# Patient Record
Sex: Female | Born: 1995 | Race: White | Hispanic: No | Marital: Single | Smoking: Never smoker
Health system: Southern US, Community
[De-identification: ages and names within clinical notes are randomized; demographics above are authoritative.]

## PROBLEM LIST (undated history)

## (undated) HISTORY — PX: TONSILLECTOMY: SUR1361

---

## 2017-11-30 ENCOUNTER — Emergency Department (HOSPITAL_COMMUNITY)
Admission: EM | Admit: 2017-11-30 | Discharge: 2017-11-30 | Disposition: A | Discharge status: Home / Self Care | Attending: Emergency Medicine | Admitting: Emergency Medicine

## 2017-11-30 ENCOUNTER — Encounter (HOSPITAL_COMMUNITY): Payer: Self-pay | Admitting: Emergency Medicine

## 2017-11-30 ENCOUNTER — Emergency Department (HOSPITAL_COMMUNITY)

## 2017-11-30 DIAGNOSIS — R0789 Other chest pain: Secondary | ICD-10-CM

## 2017-11-30 DIAGNOSIS — R0602 Shortness of breath: Secondary | ICD-10-CM | POA: Diagnosis not present

## 2017-11-30 DIAGNOSIS — R000 Tachycardia, unspecified: Secondary | ICD-10-CM | POA: Insufficient documentation

## 2017-11-30 DIAGNOSIS — R42 Dizziness and giddiness: Secondary | ICD-10-CM | POA: Diagnosis not present

## 2017-11-30 LAB — TSH: TSH: 2.545 u[IU]/mL (ref 0.350–4.500)

## 2017-11-30 LAB — I-STAT BETA HCG BLOOD, ED (MC, WL, AP ONLY): I-stat hCG, quantitative: <5 m[IU]/mL (ref <5)

## 2017-11-30 LAB — CBC
HCT: 41.6 % (ref 36.0–46.0)
HEMOGLOBIN: 14.1 g/dL (ref 12.0–15.0)
MCH: 28.8 pg (ref 26.0–34.0)
MCHC: 33.9 g/dL (ref 30.0–36.0)
MCV: 85.1 fL (ref 78.0–100.0)
PLATELETS: 309 10*3/uL (ref 150–400)
RBC: 4.89 MIL/uL (ref 3.87–5.11)
RDW: 13.2 % (ref 11.5–15.5)
WBC: 10.6 10*3/uL — ABNORMAL HIGH (ref 4.0–10.5)

## 2017-11-30 LAB — BASIC METABOLIC PANEL
Anion gap: 11 (ref 5–15)
BUN: 11 mg/dL (ref 6–20)
CALCIUM: 9.4 mg/dL (ref 8.9–10.3)
CO2: 24 mmol/L (ref 22–32)
CREATININE: 0.8 mg/dL (ref 0.44–1.00)
Chloride: 105 mmol/L (ref 101–111)
Glucose, Bld: 82 mg/dL (ref 65–99)
Potassium: 3.3 mmol/L — ABNORMAL LOW (ref 3.5–5.1)
Sodium: 140 mmol/L (ref 135–145)

## 2017-11-30 LAB — I-STAT TROPONIN, ED: TROPONIN I, POC: 0 ng/mL (ref 0.00–0.08)

## 2017-11-30 LAB — D-DIMER, QUANTITATIVE (NOT AT ARMC)

## 2017-11-30 MED ORDER — KETOROLAC TROMETHAMINE 30 MG/ML IJ SOLN
15.0000 mg | Freq: Once | INTRAMUSCULAR | Status: AC
  Administered 2017-11-30: 15 mg via INTRAVENOUS
  Filled 2017-11-30: qty 1

## 2017-11-30 MED ORDER — METOPROLOL TARTRATE 5 MG/5ML IV SOLN
5.0000 mg | Freq: Once | INTRAVENOUS | Status: AC
  Administered 2017-11-30: 5 mg via INTRAVENOUS
  Filled 2017-11-30: qty 5

## 2017-11-30 MED ORDER — SODIUM CHLORIDE 0.9 % IV BOLUS (SEPSIS)
1000.0000 mL | Freq: Once | INTRAVENOUS | Status: AC
  Administered 2017-11-30: 1000 mL via INTRAVENOUS

## 2017-11-30 MED ORDER — LORAZEPAM 2 MG/ML IJ SOLN
0.5000 mg | Freq: Once | INTRAMUSCULAR | Status: AC
  Administered 2017-11-30: 0.5 mg via INTRAVENOUS
  Filled 2017-11-30: qty 1

## 2017-11-30 NOTE — ED Notes (Signed)
Attempted to discharge patient. Patient was not in room. Attempted to call contact number but no answer.

## 2017-11-30 NOTE — Discharge Instructions (Signed)
Please follow up with Cardiology

## 2017-11-30 NOTE — ED Triage Notes (Signed)
Patient c/o central chest pain for past couple days with SOB talking and walking at times.

## 2017-11-30 NOTE — ED Provider Notes (Signed)
[facility]-EMERGENCY DEPT Provider Note   CSN: [id number] Arrival date & time: 11/30/17  1640     History   Chief Complaint Chief Complaint  Patient presents with  . Chest Pain    HPI Denise Hays is a 22 y.o. female who presents with chest pain and SOB. No significant PMH. She states that over the past 5-6 days she's had persistent chest pain which she describes as a tightness and pressure. It is constant. Nothing makes it better or worse. Sitting up does not make it better. Over the past 1-2 days she's had SOB as well. This is with talking and exertion. No recent illness or URI. No cough, abdominal pain, N/V. No recent surgery/travel/immobilization, hx of cancer, leg swelling, hemptysis, prior DVT/PE. She is on Depo-Provera. She denies illicit drug use.   HPI  History reviewed. No pertinent past medical history.  There are no active problems to display for this patient.   History reviewed. No pertinent surgical history.  OB History    No data available       Home Medications    Prior to Admission medications   Medication Sig Start Date End Date Taking? Authorizing Provider  doxylamine, Sleep, (UNISOM) 25 MG tablet Take 25 mg by mouth at bedtime as needed for sleep.   Yes [provider]  ibuprofen (ADVIL,MOTRIN) 200 MG tablet Take 200 mg by mouth every 4 (four) hours as needed for headache.   Yes [provider]  medroxyPROGESTERone (DEPO-PROVERA) 150 MG/ML injection Inject 150 mg into the muscle every 3 (three) months.   Yes [provider]    Family History No family history on file.  Social History Social History   Tobacco Use  . Smoking status: Never Smoker  . Smokeless tobacco: Never Used  Substance Use Topics  . Alcohol use: No    Frequency: Never  . Drug use: Not on file     Allergies   Patient has no known allergies.   Review of Systems Review of Systems  Constitutional: Negative for  chills and fever.  Respiratory: Positive for chest tightness and shortness of breath. Negative for cough and wheezing.   Cardiovascular: Positive for chest pain and palpitations. Negative for leg swelling.  Gastrointestinal: Negative for abdominal pain, nausea and vomiting.  Neurological: Positive for light-headedness. Negative for syncope.  All other systems reviewed and are negative.    Physical Exam Updated Vital Signs BP (!) 154/74   Pulse (!) 128   Temp 98.3 F (36.8 C) (Oral)   Resp 20   Ht 5\' 2"  (1.575 m)   Wt 65.8 kg (145 lb)   SpO2 100%   BMI 26.52 kg/m   Physical Exam  Constitutional: She is oriented to person, place, and time. She appears well-developed and well-nourished. No distress.  HENT:  Head: Normocephalic and atraumatic.  Eyes: Conjunctivae are normal. Pupils are equal, round, and reactive to light. Right eye exhibits no discharge. Left eye exhibits no discharge. No scleral icterus.  Neck: Normal range of motion.  Cardiovascular: Tachycardia present. Exam reveals no gallop and no friction rub.  No murmur heard. Pulmonary/Chest: Effort normal and breath sounds normal. No respiratory distress.  Abdominal: Soft. Bowel sounds are normal. She exhibits no distension. There is no tenderness.  Musculoskeletal:  No calf tenderness  Neurological: She is alert and oriented to person, place, and time.  Skin: Skin is warm and dry.  Psychiatric: She has a normal mood and affect. Her behavior is  normal.  Nursing note and vitals reviewed.    ED Treatments / Results  Labs (all labs ordered are listed, but only abnormal results are displayed) Labs Reviewed  BASIC METABOLIC PANEL - Abnormal; Notable for the following components:      Result Value   Potassium 3.3 (*)    All other components within normal limits  CBC - Abnormal; Notable for the following components:   WBC 10.6 (*)    All other components within normal limits  D-DIMER, QUANTITATIVE (NOT AT [facility])  TSH   I-STAT TROPONIN, ED  I-STAT BETA HCG BLOOD, ED ([facility], [facility], [facility] ONLY)    EKG  EKG Interpretation None       Radiology Dg Chest 2 View  Result Date: 11/30/2017 CLINICAL DATA:  Central chest pain for 5-6 days with tightness, feels like something is lying on my chest, frontal headache, shortness of breath for 2 days EXAM: CHEST  2 VIEW COMPARISON:  None FINDINGS: Normal heart size, mediastinal contours, and pulmonary vascularity. Lungs clear. No pleural effusion or pneumothorax. Bones unremarkable. IMPRESSION: Normal exam. Electronically Signed   By: Ulyses SouthwardMark  Boles M.D.   On: 11/30/2017 17:22    Procedures Procedures (including critical care time)  Medications Ordered in ED Medications  sodium chloride 0.9 % bolus 1,000 mL (0 mLs Intravenous Stopped 11/30/17 2255)  ketorolac (TORADOL) 30 MG/ML injection 15 mg (15 mg Intravenous Given 11/30/17 1929)  LORazepam (ATIVAN) injection 0.5 mg (0.5 mg Intravenous Given 11/30/17 2025)  metoprolol tartrate (LOPRESSOR) injection 5 mg (5 mg Intravenous Given 11/30/17 2150)    Initial Impression / Assessment and Plan / ED Course  I have reviewed the triage vital signs and the nursing notes.  Pertinent labs & imaging results that were available during my care of the patient were reviewed by me and considered in my medical decision making (see chart for details).  22 year old female presents with chest pain and SOB. She is tachycardic in the 130s. Otherwise vitals are normal and she is well appearing. EKG is sinus tachycardia. CXR is negative. CBC is remarkable for mild leukocytosis. BMP is remarkable or mild hypokalemia. Trop is normal. TSH is normal. D dimer is normal. Shared visit with Dr. Estell HarpinZammit. Will give fluids, Toradol, Ativan and reassess.   9:37 PM Pt's HR is still in 120s. Will give dose of Metoprolol.  After Metoprolol, HR has improved to 90s. Work up is overall unremarkable. Advised pt to follow up with Cardiology. She verbalized understanding.  Return precautions given.  Final Clinical Impressions(s) / ED Diagnoses   Final diagnoses:  Atypical chest pain  Tachycardia    ED Discharge Orders    None       Beryle QuantGekas, Jase Himmelberger Marie, PA-C 11/30/17 2334    Bethann BerkshireZammit, Joseph, MD 12/01/17 [phone]

## 2017-11-30 NOTE — ED Notes (Signed)
Patient was discharged

## 2017-11-30 NOTE — ED Notes (Signed)
Patient transported to X-ray 

## 2017-12-09 ENCOUNTER — Ambulatory Visit (INDEPENDENT_AMBULATORY_CARE_PROVIDER_SITE_OTHER): Admitting: Cardiology

## 2017-12-09 ENCOUNTER — Encounter: Payer: Self-pay | Admitting: *Deleted

## 2017-12-09 ENCOUNTER — Encounter: Payer: Self-pay | Admitting: Cardiology

## 2017-12-09 DIAGNOSIS — R0789 Other chest pain: Secondary | ICD-10-CM | POA: Diagnosis not present

## 2017-12-09 DIAGNOSIS — Z8669 Personal history of other diseases of the nervous system and sense organs: Secondary | ICD-10-CM | POA: Diagnosis not present

## 2017-12-09 NOTE — Progress Notes (Signed)
Cardiology Consultation:    Date:  12/09/2017   ID:  Denise Hays, DOB Jul 14, 1996, MRN [id number]  PCP:  System, Provider Not In  Cardiologist:  Gypsy Balsamobert Adoni Greenough, MD   Referring MD: Anselmo PicklerAchreja, Denise Hays, *   Chief Complaint  Patient presents with  . Chest Pain  Having chest pain  History of Present Illness:    Denise Hays is a 22 y.o. female who is being seen today for the evaluation of chest pain at the request of Achreja, Denise Hays, *.  For last few weeks she has been experiencing chest pain.  She described this as heavy sensation in the chest not related to exercise can be lasting up to 1 hour.  Is no shortness of breath no sweating associated with this sensation.  At the same time she can walk climb stairs with no difficulty.  Her EKG was normal today.  She does not have any risk factors for coronary artery disease, she tells me that her cholesterol is normal.  She does not have hypertension does not have diabetes does not have family history of premature coronary artery disease.  She never smoked.  She does have migraine headaches for which he did she takes 800 mg of Motrin.  She been doing this for long time however recently a little more frequently because of more frequent headaches.  No past medical history on file.  Past Surgical History:  Procedure Laterality Date  . TONSILLECTOMY      Current Medications: Current Meds  Medication Sig  . doxylamine, Sleep, (UNISOM) 25 MG tablet Take 25 mg by mouth at bedtime as needed for sleep.  Marland Kitchen. ibuprofen (ADVIL,MOTRIN) 200 MG tablet Take 200 mg by mouth every 4 (four) hours as needed for headache.  . medroxyPROGESTERone (DEPO-PROVERA) 150 MG/ML injection Inject 150 mg into the muscle every 3 (three) months.     Allergies:   Patient has no known allergies.   Social History   Socioeconomic History  . Marital status: Single    Spouse name: None  . Number of children: None  . Years of education: None  .  Highest education level: None  Social Needs  . Financial resource strain: None  . Food insecurity - worry: None  . Food insecurity - inability: None  . Transportation needs - medical: None  . Transportation needs - non-medical: None  Occupational History  . None  Tobacco Use  . Smoking status: Never Smoker  . Smokeless tobacco: Never Used  Substance and Sexual Activity  . Alcohol use: No    Frequency: Never  . Drug use: No  . Sexual activity: None  Other Topics Concern  . None  Social History Narrative  . None     Family History: The patient's family history includes Hypertension in her maternal grandfather and mother; Stroke in her maternal grandfather. ROS:   Please see the history of present illness.    All 14 point review of systems negative except as described per history of present illness.  EKGs/Labs/Other Studies Reviewed:    The following studies were reviewed today:   EKG:  EKG is  ordered today.  The ekg ordered today demonstrates normal sinus rhythm normal P interval normal QS complex duration morphology no ST-T segment changes  Recent Labs: 11/30/2017: BUN 11; Creatinine, Ser 0.80; Hemoglobin 14.1; Platelets 309; Potassium 3.3; Sodium 140; TSH 2.545  Recent Lipid Panel No results found for: CHOL, TRIG, HDL, CHOLHDL, VLDL, LDLCALC, LDLDIRECT  Physical Exam:  VS:  BP 130/74 (BP Location: Left Arm, Patient Position: Sitting, Cuff Size: Normal)   Pulse (!) 111   Ht 5\' 2"  (1.575 m)   Wt 154 lb (69.9 kg)   SpO2 99%   BMI 28.17 kg/m     Wt Readings from Last 3 Encounters:  12/09/17 154 lb (69.9 kg) (83 %, Z= 0.96)*  11/30/17 145 lb (65.8 kg) (75 %, Z= 0.68)*   * Growth percentiles are based on CDC (Girls, 2-20 Years) data.     GEN:  Well nourished, well developed in no acute distress HEENT: Normal NECK: No JVD; No carotid bruits LYMPHATICS: No lymphadenopathy CARDIAC: RRR, no murmurs, no rubs, no gallops RESPIRATORY:  Clear to auscultation without  rales, wheezing or rhonchi  ABDOMEN: Soft, non-tender, non-distended MUSCULOSKELETAL:  No edema; No deformity  SKIN: Warm and dry NEUROLOGIC:  Alert and oriented x 3 PSYCHIATRIC:  Normal affect   ASSESSMENT:    1. Atypical chest pain   2. History of migraine headaches    PLAN:    In order of problems listed above:  1. Atypical chest pain: I doubt very much that this is coronary artery disease that she does not have any risk factors for it.  I offer her stress test however she said that she does not want to do it.  I suspect her pain could be related to overuse of nonsteroidal anti-inflammatory medication therefore asked her to get omeprazole at the pharmacy as well as Maalox and Mylanta when tried on the regular basis for a few days and see if it helps.  Also asked her to reduce her Motrin intake.  I told her to take Tylenol and on top of that if Tylenol does not take care of the pain she may take some extra Motrin may be up to 200 mg daily.  Also her pain was partially reproducible by pressing the chest wall.  She may be having costochondritis. 2. Migraine headaches: Plan as outlined above.  I will see her back in my office in about 1 month or sooner if she has a problem   Medication Adjustments/Labs and Tests Ordered: Current medicines are reviewed at length with the patient today.  Concerns regarding medicines are outlined above.  No orders of the defined types were placed in this encounter.  No orders of the defined types were placed in this encounter.   Signed, Georgeanna Lea, MD, [facility]. 12/09/2017 11:11 AM     [facility]

## 2017-12-09 NOTE — Patient Instructions (Signed)
Medication Instructions:  Your physician recommends that you continue on your current medications as directed. Please refer to the Current Medication list given to you today.  Labwork: None ordered  Testing/Procedures: None ordered  Follow-Up: Your physician recommends that you schedule a follow-up appointment in: 1 month with Dr. Krasowski   Any Other Special Instructions Will Be Listed Below (If Applicable).     If you need a refill on your cardiac medications before your next appointment, please call your pharmacy.   

## 2018-01-06 ENCOUNTER — Ambulatory Visit: Admitting: Cardiology

## 2018-01-06 DIAGNOSIS — R002 Palpitations: Secondary | ICD-10-CM | POA: Diagnosis not present

## 2019-05-01 ENCOUNTER — Other Ambulatory Visit: Payer: Self-pay

## 2019-05-01 ENCOUNTER — Encounter (HOSPITAL_COMMUNITY): Payer: Self-pay | Admitting: Family Medicine

## 2019-05-01 ENCOUNTER — Emergency Department (HOSPITAL_COMMUNITY)
Admission: EM | Admit: 2019-05-01 | Discharge: 2019-05-02 | Disposition: A | Discharge status: Home / Self Care | Attending: Emergency Medicine | Admitting: Emergency Medicine

## 2019-05-01 DIAGNOSIS — N83202 Unspecified ovarian cyst, left side: Secondary | ICD-10-CM | POA: Diagnosis not present

## 2019-05-01 DIAGNOSIS — R197 Diarrhea, unspecified: Secondary | ICD-10-CM | POA: Insufficient documentation

## 2019-05-01 DIAGNOSIS — Z79899 Other long term (current) drug therapy: Secondary | ICD-10-CM | POA: Diagnosis not present

## 2019-05-01 DIAGNOSIS — R1032 Left lower quadrant pain: Secondary | ICD-10-CM | POA: Diagnosis present

## 2019-05-01 MED ORDER — SODIUM CHLORIDE 0.9% FLUSH
3.0000 mL | Freq: Once | INTRAVENOUS | Status: AC
  Administered 2019-05-02: 3 mL via INTRAVENOUS

## 2019-05-01 MED ORDER — SODIUM CHLORIDE 0.9 % IV BOLUS
1000.0000 mL | Freq: Once | INTRAVENOUS | Status: AC
  Administered 2019-05-02: 1000 mL via INTRAVENOUS

## 2019-05-01 NOTE — ED Triage Notes (Signed)
Patient is complaining of lower left quad pain that started last night. Associated symptoms of nausea, diarrhea, distention, and loss of appetite. Pain is described as sharp and burning. Denies any urinary symptoms. Reports vaginal discharge started 2 days ago.

## 2019-05-01 NOTE — ED Notes (Signed)
ED Provider at bedside. 

## 2019-05-01 NOTE — ED Notes (Signed)
Pelvic exam set up and ready at bedside 

## 2019-05-01 NOTE — ED Provider Notes (Signed)
[facility]-EMERGENCY DEPT Provider Note   CSN: [id number] Arrival date & time: 05/01/19  2139    History   Chief Complaint Chief Complaint  Patient presents with  . Abdominal Pain    HPI Margie BilletBreann N Boteler is a 23 y.o. female.     HPI   Margie BilletBreann N Kewley is a 23 y.o. female, with a history of migraine, presenting to the ED with abdominal pain beginning last night.  Pain is left lower quadrant, waxing and waning, 3/10 aching and nagging followed by 10/10 sharp and stabbing, sometimes radiating to the left lower back.  Accompanied by nausea with spikes of pain and one episode of loose stool yesterday. Also notes onset of small amount of white vaginal discharge 2 days ago. LMP at the end of June.  Sexually active with one female partner.  No suspicion for STDs at this time. Denies fever/chills, vomiting, hematochezia/melena, urinary complaints, chest pain, shortness of breath, abnormal vaginal bleeding, or any other complaints.    History reviewed. No pertinent past medical history.  Patient Active Problem List   Diagnosis Date Noted  . Atypical chest pain 12/09/2017  . History of migraine headaches 12/09/2017    Past Surgical History:  Procedure Laterality Date  . TONSILLECTOMY       OB History   No obstetric history on file.      Home Medications    Prior to Admission medications   Medication Sig Start Date End Date Taking? Authorizing Provider  acetaminophen (TYLENOL) 500 MG tablet Take 1,000 mg by mouth every 6 (six) hours as needed for moderate pain.   Yes [provider]  Butalbital-APAP-Caffeine 50-300-40 MG CAPS Take 1 tablet by mouth every 8 (eight) hours as needed (Migraine).  04/12/19  Yes [provider]  dimenhyDRINATE (MOTION SICKNESS) 50 MG tablet Take 50 mg by mouth every 8 (eight) hours as needed for nausea or dizziness.   Yes [provider]  doxylamine, Sleep, (UNISOM) 25 MG tablet Take 75-100 mg by  mouth at bedtime as needed for sleep.    Yes [provider]  etonogestrel (NEXPLANON) 68 MG IMPL implant 1 each by Subdermal route once. 06/30/18  Yes [provider]  ibuprofen (ADVIL,MOTRIN) 200 MG tablet Take 600 mg by mouth every 4 (four) hours as needed for headache or moderate pain.    Yes [provider]  topiramate (TOPAMAX) 50 MG tablet Take 50 mg by mouth daily.  04/12/19  Yes [provider]    Family History Family History  Problem Relation Age of Onset  . Hypertension Mother   . Stroke Maternal Grandfather   . Hypertension Maternal Grandfather     Social History Social History   Tobacco Use  . Smoking status: Never Smoker  . Smokeless tobacco: Never Used  Substance Use Topics  . Alcohol use: No    Frequency: Never  . Drug use: No     Allergies   Patient has no known allergies.   Review of Systems Review of Systems  Constitutional: Negative for chills, diaphoresis and fever.  Respiratory: Negative for shortness of breath.   Cardiovascular: Negative for chest pain.  Gastrointestinal: Positive for abdominal pain and nausea. Negative for blood in stool and vomiting.  Genitourinary: Positive for vaginal discharge. Negative for dysuria, flank pain, hematuria and vaginal bleeding.  Neurological: Negative for syncope, weakness and numbness.  All other systems reviewed and are negative.    Physical Exam Updated Vital Signs BP (!) 150/93 (BP  Location: Left Arm)   Pulse 93   Temp 98.4 F (36.9 C) (Oral)   Resp 16   Ht 5\' 2"  (1.575 m)   Wt 61.2 kg   LMP 04/15/2019   SpO2 100%   BMI 24.69 kg/m   Physical Exam Vitals signs and nursing note reviewed.  Constitutional:      General: She is not in acute distress.    Appearance: She is well-developed. She is not diaphoretic.  HENT:     Head: Normocephalic and atraumatic.     Mouth/Throat:     Mouth: Mucous membranes are moist.     Pharynx: Oropharynx is clear.  Eyes:      Conjunctiva/sclera: Conjunctivae normal.  Neck:     Musculoskeletal: Neck supple.  Cardiovascular:     Rate and Rhythm: Normal rate and regular rhythm.     Pulses: Normal pulses.          Radial pulses are 2+ on the right side and 2+ on the left side.       Posterior tibial pulses are 2+ on the right side and 2+ on the left side.     Heart sounds: Normal heart sounds.     Comments: Tactile temperature in the extremities appropriate and equal bilaterally. Pulmonary:     Effort: Pulmonary effort is normal. No respiratory distress.     Breath sounds: Normal breath sounds.  Abdominal:     Palpations: Abdomen is soft.     Tenderness: There is abdominal tenderness in the left lower quadrant. There is no right CVA tenderness, left CVA tenderness or guarding.    Genitourinary:    Vagina: Vaginal discharge present.     Cervix: No cervical motion tenderness.     Comments: External genitalia normal Vagina with large amount of thick nonpurulent- appearing white discharge Cervix  normal negative for cervical motion tenderness Adnexa palpated, no masses, negative for tenderness noted Bladder palpated negative for tenderness Uterus palpated no masses, negative for tenderness  No inguinal lymphadenopathy. Otherwise normal female genitalia. RN, Florentina AddisonKatie, served as Biomedical engineerchaperone during exam. Musculoskeletal:     Right lower leg: No edema.     Left lower leg: No edema.  Lymphadenopathy:     Cervical: No cervical adenopathy.  Skin:    General: Skin is warm and dry.  Neurological:     Mental Status: She is alert.  Psychiatric:        Mood and Affect: Mood and affect normal.        Speech: Speech normal.        Behavior: Behavior normal.      ED Treatments / Results  Labs (all labs ordered are listed, but only abnormal results are displayed) Labs Reviewed  WET PREP, GENITAL  CBC WITH DIFFERENTIAL/PLATELET  LIPASE, BLOOD  COMPREHENSIVE METABOLIC PANEL  URINALYSIS, ROUTINE W REFLEX  MICROSCOPIC  RPR  HIV ANTIBODY (ROUTINE TESTING W REFLEX)  I-STAT BETA HCG BLOOD, ED ([facility], [facility], [facility] ONLY)  GC/CHLAMYDIA PROBE AMP ([facility]) NOT AT [facility]    EKG None  Radiology No results found.  Procedures Pelvic exam  Date/Time: 05/02/2019 12:20 AM Performed by: Anselm PancoastJoy,  C, PA-C Authorized by: Anselm PancoastJoy,  C, PA-C  Consent: Verbal consent obtained. Risks and benefits: risks, benefits and alternatives were discussed Consent given by: patient Patient identity confirmed: verbally with patient and provided demographic data Local anesthesia used: no  Anesthesia: Local anesthesia used: no  Sedation: Patient sedated: no  Patient tolerance: patient tolerated the procedure well with  no immediate complications    (including critical care time)  Medications Ordered in ED Medications  sodium chloride flush (NS) 0.9 % injection 3 mL (has no administration in time range)  sodium chloride 0.9 % bolus 1,000 mL (1,000 mLs Intravenous New Bag/Given 05/02/19 0006)     Initial Impression / Assessment and Plan / ED Course  I have reviewed the triage vital signs and the nursing notes.  Pertinent labs & imaging results that were available during my care of the patient were reviewed by me and considered in my medical decision making (see chart for details).        Patient presents with lower abdominal pain beginning last night. Patient is nontoxic appearing, afebrile, not persistently tachycardic, not tachypneic, not hypotensive, maintains excellent SPO2 on room air, and is in no apparent distress. She does have some vaginal discharge on exam, but without CMT.  Pregnancy test negative. Declined analgesia during initial interview.  End of shift patient care handoff report given to Quincy Carnes, PA-C. Plan: Pelvic US pending. If normal, reassess and see if patient needs CT. CMP also pending.   Vitals:   05/01/19 2236 05/01/19 2330 05/02/19 0006  BP: (!) 150/93 (!) 154/90 (!) 130/91   Pulse: 93 (!) 107 97  Resp: 16    Temp: 98.4 F (36.9 C)    TempSrc: Oral    SpO2: 100% 100% 100%  Weight: 61.2 kg    Height: 5\' 2"  (1.575 m)       Final Clinical Impressions(s) / ED Diagnoses   Final diagnoses:  None    ED Discharge Orders    None       Layla Maw 05/02/19 0055    Mesner, Corene Cornea, MD 05/02/19 [phone]

## 2019-05-02 ENCOUNTER — Emergency Department (HOSPITAL_COMMUNITY)

## 2019-05-02 LAB — URINALYSIS, ROUTINE W REFLEX MICROSCOPIC
Bilirubin Urine: NEGATIVE
Glucose, UA: NEGATIVE mg/dL
Hgb urine dipstick: NEGATIVE
Ketones, ur: 80 mg/dL — AB
Leukocytes,Ua: NEGATIVE
Nitrite: NEGATIVE
Protein, ur: NEGATIVE mg/dL
Specific Gravity, Urine: 1.017 (ref 1.005–1.030)
pH: 6 (ref 5.0–8.0)

## 2019-05-02 LAB — COMPREHENSIVE METABOLIC PANEL
ALT: 16 U/L (ref 0–44)
AST: 22 U/L (ref 15–41)
Albumin: 4.6 g/dL (ref 3.5–5.0)
Alkaline Phosphatase: 79 U/L (ref 38–126)
Anion gap: 14 (ref 5–15)
BUN: 8 mg/dL (ref 6–20)
CO2: 20 mmol/L — ABNORMAL LOW (ref 22–32)
Calcium: 9 mg/dL (ref 8.9–10.3)
Chloride: 105 mmol/L (ref 98–111)
Creatinine, Ser: 0.67 mg/dL (ref 0.44–1.00)
GFR calc Af Amer: >60 mL/min (ref >60)
GFR calc non Af Amer: >60 mL/min (ref >60)
Glucose, Bld: 84 mg/dL (ref 70–99)
Potassium: 3.6 mmol/L (ref 3.5–5.1)
Sodium: 139 mmol/L (ref 135–145)
Total Bilirubin: 0.4 mg/dL (ref 0.3–1.2)
Total Protein: 7.4 g/dL (ref 6.5–8.1)

## 2019-05-02 LAB — CBC WITH DIFFERENTIAL/PLATELET
Abs Immature Granulocytes: 0.02 10*3/uL (ref 0.00–0.07)
Basophils Absolute: 0 10*3/uL (ref 0.0–0.1)
Basophils Relative: 0 %
Eosinophils Absolute: 0.1 10*3/uL (ref 0.0–0.5)
Eosinophils Relative: 1 %
HCT: 45.7 % (ref 36.0–46.0)
Hemoglobin: 14.7 g/dL (ref 12.0–15.0)
Immature Granulocytes: 0 %
Lymphocytes Relative: 29 %
Lymphs Abs: 2.6 10*3/uL (ref 0.7–4.0)
MCH: 29.9 pg (ref 26.0–34.0)
MCHC: 32.2 g/dL (ref 30.0–36.0)
MCV: 92.9 fL (ref 80.0–100.0)
Monocytes Absolute: 0.7 10*3/uL (ref 0.1–1.0)
Monocytes Relative: 8 %
Neutro Abs: 5.3 10*3/uL (ref 1.7–7.7)
Neutrophils Relative %: 62 %
Platelets: 236 10*3/uL (ref 150–400)
RBC: 4.92 MIL/uL (ref 3.87–5.11)
RDW: 12.7 % (ref 11.5–15.5)
WBC: 8.7 10*3/uL (ref 4.0–10.5)
nRBC: 0 % (ref 0.0–0.2)

## 2019-05-02 LAB — GC/CHLAMYDIA PROBE AMP (~~LOC~~) NOT AT ARMC
Chlamydia: NEGATIVE
Neisseria Gonorrhea: NEGATIVE

## 2019-05-02 LAB — HIV ANTIBODY (ROUTINE TESTING W REFLEX): HIV Screen 4th Generation wRfx: NONREACTIVE

## 2019-05-02 LAB — RPR: RPR Ser Ql: NONREACTIVE

## 2019-05-02 LAB — WET PREP, GENITAL
Clue Cells Wet Prep HPF POC: NONE SEEN
Sperm: NONE SEEN
Trich, Wet Prep: NONE SEEN

## 2019-05-02 LAB — LIPASE, BLOOD: Lipase: 27 U/L (ref 11–51)

## 2019-05-02 LAB — I-STAT BETA HCG BLOOD, ED (MC, WL, AP ONLY): I-stat hCG, quantitative: <5 m[IU]/mL (ref <5)

## 2019-05-02 MED ORDER — KETOROLAC TROMETHAMINE 30 MG/ML IJ SOLN
30.0000 mg | Freq: Once | INTRAMUSCULAR | Status: AC
  Administered 2019-05-02: 30 mg via INTRAVENOUS
  Filled 2019-05-02: qty 1

## 2019-05-02 MED ORDER — FENTANYL CITRATE (PF) 100 MCG/2ML IJ SOLN
50.0000 ug | Freq: Once | INTRAMUSCULAR | Status: AC
  Administered 2019-05-02: 50 ug via INTRAVENOUS
  Filled 2019-05-02: qty 2

## 2019-05-02 MED ORDER — MORPHINE SULFATE (PF) 4 MG/ML IV SOLN
4.0000 mg | Freq: Once | INTRAVENOUS | Status: AC
  Administered 2019-05-02: 4 mg via INTRAVENOUS
  Filled 2019-05-02: qty 1

## 2019-05-02 MED ORDER — ONDANSETRON HCL 4 MG/2ML IJ SOLN
4.0000 mg | Freq: Once | INTRAMUSCULAR | Status: AC
  Administered 2019-05-02: 4 mg via INTRAVENOUS
  Filled 2019-05-02: qty 2

## 2019-05-02 MED ORDER — HYDROCODONE-ACETAMINOPHEN 5-325 MG PO TABS: 1.0000 | ORAL_TABLET | ORAL | 0 refills | Status: DC | PRN

## 2019-05-02 MED ORDER — FLUCONAZOLE 150 MG PO TABS: 150.0000 mg | ORAL_TABLET | Freq: Once | ORAL | 0 refills | Status: AC

## 2019-05-02 NOTE — Discharge Instructions (Addendum)
Take the prescribed medication as directed. Follow-up with OB/GYN-- call for appt. Return to the ED for new or worsening symptoms.

## 2019-05-02 NOTE — ED Provider Notes (Signed)
Assumed care from PA Joy.  See prior notes for full H&P.  Briefly, 23 y.o. F here with left pelvic pain.  Plan:  Wet prep with yeast.  Remainder of work-up and pelvis US Pending.  Results for orders placed or performed during the hospital encounter of 05/01/19  Wet prep, genital   Specimen: Cervical/Vaginal swab  Result Value Ref Range   Yeast Wet Prep HPF POC PRESENT (A) NONE SEEN   Trich, Wet Prep NONE SEEN NONE SEEN   Clue Cells Wet Prep HPF POC NONE SEEN NONE SEEN   WBC, Wet Prep HPF POC MANY (A) NONE SEEN   Sperm NONE SEEN   Lipase, blood  Result Value Ref Range   Lipase 27 11 - 51 U/L  Comprehensive metabolic panel  Result Value Ref Range   Sodium 139 135 - 145 mmol/L   Potassium 3.6 3.5 - 5.1 mmol/L   Chloride 105 98 - 111 mmol/L   CO2 20 (L) 22 - 32 mmol/L   Glucose, Bld 84 70 - 99 mg/dL   BUN 8 6 - 20 mg/dL   Creatinine, Ser 2.950.67 0.44 - 1.00 mg/dL   Calcium 9.0 8.9 - 62.110.3 mg/dL   Total Protein 7.4 6.5 - 8.1 g/dL   Albumin 4.6 3.5 - 5.0 g/dL   AST 22 15 - 41 U/L   ALT 16 0 - 44 U/L   Alkaline Phosphatase 79 38 - 126 U/L   Total Bilirubin 0.4 0.3 - 1.2 mg/dL   GFR calc non Af Amer >60 >60 mL/min   GFR calc Af Amer >60 >60 mL/min   Anion gap 14 5 - 15  Urinalysis, Routine w reflex microscopic  Result Value Ref Range   Color, Urine YELLOW YELLOW   APPearance CLEAR CLEAR   Specific Gravity, Urine 1.017 1.005 - 1.030   pH 6.0 5.0 - 8.0   Glucose, UA NEGATIVE NEGATIVE mg/dL   Hgb urine dipstick NEGATIVE NEGATIVE   Bilirubin Urine NEGATIVE NEGATIVE   Ketones, ur 80 (A) NEGATIVE mg/dL   Protein, ur NEGATIVE NEGATIVE mg/dL   Nitrite NEGATIVE NEGATIVE   Leukocytes,Ua NEGATIVE NEGATIVE  CBC with Differential  Result Value Ref Range   WBC 8.7 4.0 - 10.5 K/uL   RBC 4.92 3.87 - 5.11 MIL/uL   Hemoglobin 14.7 12.0 - 15.0 g/dL   HCT 30.845.7 65.736.0 - 84.646.0 %   MCV 92.9 80.0 - 100.0 fL   MCH 29.9 26.0 - 34.0 pg   MCHC 32.2 30.0 - 36.0 g/dL   RDW 96.212.7 95.211.5 - 84.115.5 %   Platelets  236 150 - 400 K/uL   nRBC 0.0 0.0 - 0.2 %   Neutrophils Relative % 62 %   Neutro Abs 5.3 1.7 - 7.7 K/uL   Lymphocytes Relative 29 %   Lymphs Abs 2.6 0.7 - 4.0 K/uL   Monocytes Relative 8 %   Monocytes Absolute 0.7 0.1 - 1.0 K/uL   Eosinophils Relative 1 %   Eosinophils Absolute 0.1 0.0 - 0.5 K/uL   Basophils Relative 0 %   Basophils Absolute 0.0 0.0 - 0.1 K/uL   Immature Granulocytes 0 %   Abs Immature Granulocytes 0.02 0.00 - 0.07 K/uL  I-Stat beta hCG blood, ED  Result Value Ref Range   I-stat hCG, quantitative <5.0 <5 mIU/mL   Comment 3           [place] Transvaginal Non-ob  Result Date: 05/02/2019 CLINICAL DATA:  23 year old female with left lower quadrant pain. Negative quantitative  beta HCG today. EXAM: TRANSABDOMINAL AND TRANSVAGINAL ULTRASOUND OF PELVIS DOPPLER ULTRASOUND OF OVARIES TECHNIQUE: Both transabdominal and transvaginal ultrasound examinations of the pelvis were performed. Transabdominal technique was performed for global imaging of the pelvis including uterus, ovaries, adnexal regions, and pelvic cul-de-sac. It was necessary to proceed with endovaginal exam following the transabdominal exam to visualize the ovaries. Color and duplex Doppler ultrasound was utilized to evaluate blood flow to the ovaries. COMPARISON:  None. FINDINGS: Uterus Measurements: 7.0 x 4.0 x 5.5 centimeters = volume: 80 mL. No fibroids or other mass visualized. Endometrium Thickness: 6 millimeters.  No focal abnormality visualized. Right ovary Measurements: 3.8 x 2.1 by 2.4 centimeters = volume: 10 mL. Small follicles. No abnormality. Left ovary Measurements: 4.5 x 2.8 x 3.9 centimeters = volume: 25 mL. Dominant 17 millimeter follicle or cyst with minimal internal reticular echoes (image 95). No solid elements, but there is peripheral hypervascularity (image 122). Multiple smaller follicles. Pulsed Doppler evaluation of both ovaries demonstrates normal low-resistance arterial and venous waveforms. Other  findings Trace or small simple appearing pelvic free fluid (image 30). IMPRESSION: 1. Negative for ovarian torsion.  Negative uterus. 2. Left ovary 17 mm cyst with mild internal reticular echoes and peripheral hypervascularity. Legrand RamsFavor this is a mildly hemorrhagic cyst or corpus luteum. Recommend 6-12 week repeat ultrasound to document resolution. This recommendation follows the consensus statement: Management of Asymptomatic Ovarian and Other Adnexal Cysts Imaged at US: Society of Radiologists in Ultrasound Consensus Conference Statement. Radiology 2010; [phone]. 3. Trace pelvic free fluid, likely physiologic. Electronically Signed   By: Odessa FlemingH  Hall M.D.   On: 05/02/2019 03:28   [place] Pelvis Complete  Result Date: 05/02/2019 CLINICAL DATA:  23 year old female with left lower quadrant pain. Negative quantitative beta HCG today. EXAM: TRANSABDOMINAL AND TRANSVAGINAL ULTRASOUND OF PELVIS DOPPLER ULTRASOUND OF OVARIES TECHNIQUE: Both transabdominal and transvaginal ultrasound examinations of the pelvis were performed. Transabdominal technique was performed for global imaging of the pelvis including uterus, ovaries, adnexal regions, and pelvic cul-de-sac. It was necessary to proceed with endovaginal exam following the transabdominal exam to visualize the ovaries. Color and duplex Doppler ultrasound was utilized to evaluate blood flow to the ovaries. COMPARISON:  None. FINDINGS: Uterus Measurements: 7.0 x 4.0 x 5.5 centimeters = volume: 80 mL. No fibroids or other mass visualized. Endometrium Thickness: 6 millimeters.  No focal abnormality visualized. Right ovary Measurements: 3.8 x 2.1 by 2.4 centimeters = volume: 10 mL. Small follicles. No abnormality. Left ovary Measurements: 4.5 x 2.8 x 3.9 centimeters = volume: 25 mL. Dominant 17 millimeter follicle or cyst with minimal internal reticular echoes (image 95). No solid elements, but there is peripheral hypervascularity (image 122). Multiple smaller follicles. Pulsed  Doppler evaluation of both ovaries demonstrates normal low-resistance arterial and venous waveforms. Other findings Trace or small simple appearing pelvic free fluid (image 30). IMPRESSION: 1. Negative for ovarian torsion.  Negative uterus. 2. Left ovary 17 mm cyst with mild internal reticular echoes and peripheral hypervascularity. Legrand RamsFavor this is a mildly hemorrhagic cyst or corpus luteum. Recommend 6-12 week repeat ultrasound to document resolution. This recommendation follows the consensus statement: Management of Asymptomatic Ovarian and Other Adnexal Cysts Imaged at US: Society of Radiologists in Ultrasound Consensus Conference Statement. Radiology 2010; [phone]. 3. Trace pelvic free fluid, likely physiologic. Electronically Signed   By: Odessa FlemingH  Hall M.D.   On: 05/02/2019 03:28   [place] Art/ven Flow Abd Pelv Doppler  Result Date: 05/02/2019 CLINICAL DATA:  23 year old female with left lower quadrant pain. Negative quantitative beta  HCG today. EXAM: TRANSABDOMINAL AND TRANSVAGINAL ULTRASOUND OF PELVIS DOPPLER ULTRASOUND OF OVARIES TECHNIQUE: Both transabdominal and transvaginal ultrasound examinations of the pelvis were performed. Transabdominal technique was performed for global imaging of the pelvis including uterus, ovaries, adnexal regions, and pelvic cul-de-sac. It was necessary to proceed with endovaginal exam following the transabdominal exam to visualize the ovaries. Color and duplex Doppler ultrasound was utilized to evaluate blood flow to the ovaries. COMPARISON:  None. FINDINGS: Uterus Measurements: 7.0 x 4.0 x 5.5 centimeters = volume: 80 mL. No fibroids or other mass visualized. Endometrium Thickness: 6 millimeters.  No focal abnormality visualized. Right ovary Measurements: 3.8 x 2.1 by 2.4 centimeters = volume: 10 mL. Small follicles. No abnormality. Left ovary Measurements: 4.5 x 2.8 x 3.9 centimeters = volume: 25 mL. Dominant 17 millimeter follicle or cyst with minimal internal reticular echoes  (image 95). No solid elements, but there is peripheral hypervascularity (image 122). Multiple smaller follicles. Pulsed Doppler evaluation of both ovaries demonstrates normal low-resistance arterial and venous waveforms. Other findings Trace or small simple appearing pelvic free fluid (image 30). IMPRESSION: 1. Negative for ovarian torsion.  Negative uterus. 2. Left ovary 17 mm cyst with mild internal reticular echoes and peripheral hypervascularity. Burtis Junes this is a mildly hemorrhagic cyst or corpus luteum. Recommend 6-12 week repeat ultrasound to document resolution. This recommendation follows the consensus statement: Management of Asymptomatic Ovarian and Other Adnexal Cysts Imaged at [place]: Society of Radiologists in [place]. Radiology 2010; [id number]. 3. Trace pelvic free fluid, likely physiologic. Electronically Signed   By: Genevie Ann M.D.   On: 05/02/2019 03:28    Labs reassuring.  Wet prep with yeast.  Patient has had some discharge.  No concern for STD's.  Will treat with diflucan.  [place] with left ovarian cyst, likely source of her discomfort.  Pain controlled here.  Recommended OB-GYN follow-up.  Return here for any new/acute changes.   Larene Pickett, PA-C 05/02/19 0507    Mesner, Corene Cornea, MD 05/02/19 [phone]

## 2019-05-02 NOTE — ED Notes (Signed)
Pt said unable to provide urine specimen at this time.

## 2019-05-02 NOTE — ED Notes (Signed)
Pt requested pain medication for 5/10 LLQ pain and said she nauseous.

## 2019-10-06 ENCOUNTER — Emergency Department (HOSPITAL_COMMUNITY)
Admission: EM | Admit: 2019-10-06 | Discharge: 2019-10-07 | Disposition: A | Discharge status: Home / Self Care | Attending: Emergency Medicine | Admitting: Emergency Medicine

## 2019-10-06 ENCOUNTER — Encounter (HOSPITAL_COMMUNITY): Payer: Self-pay

## 2019-10-06 ENCOUNTER — Other Ambulatory Visit: Payer: Self-pay

## 2019-10-06 DIAGNOSIS — Z79899 Other long term (current) drug therapy: Secondary | ICD-10-CM | POA: Diagnosis not present

## 2019-10-06 DIAGNOSIS — K5900 Constipation, unspecified: Secondary | ICD-10-CM

## 2019-10-06 DIAGNOSIS — R1013 Epigastric pain: Secondary | ICD-10-CM | POA: Insufficient documentation

## 2019-10-06 LAB — URINALYSIS, ROUTINE W REFLEX MICROSCOPIC
Bacteria, UA: NONE SEEN
Bilirubin Urine: NEGATIVE
Glucose, UA: NEGATIVE mg/dL
Hgb urine dipstick: NEGATIVE
Ketones, ur: NEGATIVE mg/dL
Nitrite: NEGATIVE
Protein, ur: NEGATIVE mg/dL
Specific Gravity, Urine: 1.002 — ABNORMAL LOW (ref 1.005–1.030)
pH: 7 (ref 5.0–8.0)

## 2019-10-06 LAB — CBC
HCT: 45.2 % (ref 36.0–46.0)
Hemoglobin: 15 g/dL (ref 12.0–15.0)
MCH: 29.9 pg (ref 26.0–34.0)
MCHC: 33.2 g/dL (ref 30.0–36.0)
MCV: 90.2 fL (ref 80.0–100.0)
Platelets: 263 10*3/uL (ref 150–400)
RBC: 5.01 MIL/uL (ref 3.87–5.11)
RDW: 12.3 % (ref 11.5–15.5)
WBC: 6.6 10*3/uL (ref 4.0–10.5)
nRBC: 0 % (ref 0.0–0.2)

## 2019-10-06 LAB — POC URINE PREG, ED: Preg Test, Ur: NEGATIVE

## 2019-10-06 LAB — I-STAT BETA HCG BLOOD, ED (MC, WL, AP ONLY): I-stat hCG, quantitative: <5 m[IU]/mL (ref <5)

## 2019-10-06 MED ORDER — SODIUM CHLORIDE 0.9% FLUSH: 3.0000 mL | Freq: Once | INTRAVENOUS | Status: DC

## 2019-10-06 MED ORDER — LIDOCAINE VISCOUS HCL 2 % MT SOLN
15.0000 mL | Freq: Once | OROMUCOSAL | Status: AC
  Administered 2019-10-06: 15 mL via ORAL
  Filled 2019-10-06: qty 15

## 2019-10-06 MED ORDER — ONDANSETRON 4 MG PO TBDP: 4.0000 mg | ORAL_TABLET | Freq: Once | ORAL | Status: DC

## 2019-10-06 MED ORDER — PANTOPRAZOLE SODIUM 40 MG IV SOLR
40.0000 mg | Freq: Once | INTRAVENOUS | Status: AC
  Administered 2019-10-06: 40 mg via INTRAVENOUS
  Filled 2019-10-06: qty 40

## 2019-10-06 MED ORDER — ALUM & MAG HYDROXIDE-SIMETH 200-200-20 MG/5ML PO SUSP
30.0000 mL | Freq: Once | ORAL | Status: AC
  Administered 2019-10-06: 30 mL via ORAL
  Filled 2019-10-06: qty 30

## 2019-10-06 MED ORDER — SODIUM CHLORIDE 0.9 % IV BOLUS (SEPSIS)
1000.0000 mL | Freq: Once | INTRAVENOUS | Status: AC
  Administered 2019-10-06: 1000 mL via INTRAVENOUS

## 2019-10-06 MED ORDER — ONDANSETRON HCL 4 MG/2ML IJ SOLN
4.0000 mg | Freq: Once | INTRAMUSCULAR | Status: AC
  Administered 2019-10-06: 23:00:00 4 mg via INTRAVENOUS
  Filled 2019-10-06: qty 2

## 2019-10-06 NOTE — ED Triage Notes (Signed)
Pt reports epigastric pain and vomiting x3 days. Pt reports taking a lot of NSAIDs due to pain in LLQ but reports she stopped taking them a few weeks ago.

## 2019-10-06 NOTE — ED Provider Notes (Signed)
[facility]-EMERGENCY DEPT Provider Note   CSN: [id number] Arrival date & time: 10/06/19  2118     History Chief Complaint  Patient presents with  . Emesis    Margie BilletBreann N Ghazi is a 23 y.o. female.  Patient is a 23 year old female with past medical history of migraine headaches presenting to the emergency department for epigastric pain.  Patient reports that for the last 3 days she has been having epigastric pain with nausea and intermittent vomiting.  She reports that she had actually been taking an increased amount of NSAIDs over-the-counter due to left lower quadrant pain that she has been having for some months.  This has been worked up by her primary care doctor and she is getting a referral to GI.  Reports that now when she takes NSAIDs she gets burning sensation in her epigastric area.  Reports that she stopped taking the NSAIDs but the pain continued.  Reports that she is having nonbilious vomiting but had some blood in her vomiting yesterday.  Reports that she has had decreased bowel movements and has bowel movement once every 7 to 9 days.  Reports that when she has a bowel movement it is loose.  Reports that her primary care doctor had told her to start an enema and take MiraLAX which she has been taking regularly.  Denies any dysuria, pelvic pain, vaginal bleeding or vaginal discharge.  Denies any fever, chills, cough or URI symptoms.        History reviewed. No pertinent past medical history.  Patient Active Problem List   Diagnosis Date Noted  . Atypical chest pain 12/09/2017  . History of migraine headaches 12/09/2017    Past Surgical History:  Procedure Laterality Date  . TONSILLECTOMY       OB History   No obstetric history on file.     Family History  Problem Relation Age of Onset  . Hypertension Mother   . Stroke Maternal Grandfather   . Hypertension Maternal Grandfather     Social History   Tobacco Use  . Smoking status:  Never Smoker  . Smokeless tobacco: Never Used  Substance Use Topics  . Alcohol use: No  . Drug use: No    Home Medications Prior to Admission medications   Medication Sig Start Date End Date Taking? Authorizing Provider  acetaminophen (TYLENOL) 500 MG tablet Take 1,000 mg by mouth every 6 (six) hours as needed for moderate pain.    [provider]  Butalbital-APAP-Caffeine 50-300-40 MG CAPS Take 1 tablet by mouth every 8 (eight) hours as needed (Migraine).  04/12/19   [provider]  dimenhyDRINATE (MOTION SICKNESS) 50 MG tablet Take 50 mg by mouth every 8 (eight) hours as needed for nausea or dizziness.    [provider]  doxylamine, Sleep, (UNISOM) 25 MG tablet Take 75-100 mg by mouth at bedtime as needed for sleep.     [provider]  etonogestrel (NEXPLANON) 68 MG IMPL implant 1 each by Subdermal route once. 06/30/18   [provider]  HYDROcodone-acetaminophen (NORCO/VICODIN) 5-325 MG tablet Take 1 tablet by mouth every 4 (four) hours as needed. 05/02/19   Garlon HatchetSanders, Lisa M, PA-C  ibuprofen (ADVIL,MOTRIN) 200 MG tablet Take 600 mg by mouth every 4 (four) hours as needed for headache or moderate pain.     [provider]  topiramate (TOPAMAX) 50 MG tablet Take 50 mg by mouth daily.  04/12/19   [provider]    Allergies  Patient has no known allergies.  Review of Systems   Review of Systems  Constitutional: Positive for appetite change. Negative for chills, fatigue and fever.  HENT: Negative for congestion and sore throat.   Respiratory: Negative for cough and shortness of breath.   Cardiovascular: Negative for chest pain.  Gastrointestinal: Positive for abdominal pain, nausea and vomiting. Negative for abdominal distention, anal bleeding, blood in stool, constipation, diarrhea and rectal pain.  Genitourinary: Negative for dysuria, frequency, menstrual problem, pelvic pain, urgency, vaginal bleeding, vaginal discharge  and vaginal pain.    Physical Exam Updated Vital Signs BP (!) 164/95 (BP Location: Left Arm)   Pulse (!) 102   Temp 99.4 F (37.4 C) (Oral)   Resp 18   Ht 5\' 2"  (1.575 m)   Wt 68.7 kg   SpO2 100%   BMI 27.71 kg/m   Physical Exam Vitals and nursing note reviewed.  Constitutional:      Appearance: Normal appearance.  HENT:     Head: Normocephalic.  Eyes:     Conjunctiva/sclera: Conjunctivae normal.  Pulmonary:     Effort: Pulmonary effort is normal.     Breath sounds: Normal breath sounds.  Abdominal:     General: Abdomen is flat.     Tenderness: There is abdominal tenderness in the epigastric area and left lower quadrant. There is no right CVA tenderness, left CVA tenderness, guarding or rebound.  Skin:    General: Skin is dry.  Neurological:     Mental Status: She is alert.  Psychiatric:        Mood and Affect: Mood normal.     ED Results / Procedures / Treatments   Labs (all labs ordered are listed, but only abnormal results are displayed) Labs Reviewed  CBC  COMPREHENSIVE METABOLIC PANEL  LIPASE, BLOOD  URINALYSIS, ROUTINE W REFLEX MICROSCOPIC  I-STAT BETA HCG BLOOD, ED ([facility], [facility], [facility] ONLY)    EKG None  Radiology No results found.  Procedures Procedures (including critical care time)  Medications Ordered in ED Medications  ondansetron (ZOFRAN) injection 4 mg (has no administration in time range)  sodium chloride 0.9 % bolus 1,000 mL (has no administration in time range)  alum & mag hydroxide-simeth (MAALOX/MYLANTA) 200-200-20 MG/5ML suspension 30 mL (has no administration in time range)    And  lidocaine (XYLOCAINE) 2 % viscous mouth solution 15 mL (has no administration in time range)    ED Course  I have reviewed the triage vital signs and the nursing notes.  Pertinent labs & imaging results that were available during my care of the patient were reviewed by me and considered in my medical decision making (see chart for details).  Clinical  Course as of Oct 06 1604  Sat Oct 06, 2019  2241 Patient presenting with acute epigastric pain for 3 days with nausea and vomiting.  Also has been having more chronic left lower quadrant pain with decreased bowel movements. In the setting of her taking increased NSAIDs lately this could be gastritis vs PUD.  Will try GI cocktail. However, also given her left lower quadrant symptoms and tenderness on exam, I think a CT scan may be helpful in ruling out other intraabdominal process such as colitis.    [KM]  2302 If workup is negative, plan to send out with PPI, stop Nsaids and f/u GI. Care passed to [facility] due to change of shift.    [KM]    Clinical Course User Index [KM] Alveria Apley, PA-C  MDM Rules/Calculators/A&P                       Final Clinical Impression(s) / ED Diagnoses Final diagnoses:  None    Rx / DC Orders ED Discharge Orders    None       Jeral Pinch 10/07/19 1606    Gwyneth Sprout, MD 10/07/19 1649

## 2019-10-06 NOTE — ED Provider Notes (Signed)
23 year old female received at sign out from [facility] pending labs and CT A/P. Her her HPI:  "Patient is a 23 year old female with past medical history of migraine headaches presenting to the emergency department for epigastric pain.  Patient reports that for the last 3 days she has been having epigastric pain with nausea and intermittent vomiting.  She reports that she had actually been taking an increased amount of NSAIDs over-the-counter due to left lower quadrant pain that she has been having for some months.  This has been worked up by her primary care doctor and she is getting a referral to GI.  Reports that now when she takes NSAIDs she gets burning sensation in her epigastric area.  Reports that she stopped taking the NSAIDs but the pain continued.  Reports that she is having nonbilious vomiting but had some blood in her vomiting yesterday.  Reports that she has had decreased bowel movements and has bowel movement once every 7 to 9 days.  Reports that when she has a bowel movement it is loose.  Reports that her primary care doctor had told her to start an enema and take MiraLAX which she has been taking regularly.  Denies any dysuria, pelvic pain, vaginal bleeding or vaginal discharge.  Denies any fever, chills, cough or URI symptoms."  Physical Exam  BP (!) 145/94 (BP Location: Right Arm)   Pulse (!) 103   Temp 99.4 F (37.4 C) (Oral)   Resp 18   Ht 5\' 2"  (1.575 m)   Wt 68.7 kg   SpO2 100%   BMI 27.71 kg/m   Physical Exam Vitals and nursing note reviewed.  Constitutional:      General: She is not in acute distress.    Appearance: She is not ill-appearing, toxic-appearing or diaphoretic.  HENT:     Head: Normocephalic.  Eyes:     Conjunctiva/sclera: Conjunctivae normal.  Cardiovascular:     Rate and Rhythm: Normal rate and regular rhythm.     Heart sounds: No murmur. No friction rub. No gallop.   Pulmonary:     Effort: Pulmonary effort is normal. No respiratory distress.   Breath sounds: No stridor. No wheezing, rhonchi or rales.  Chest:     Chest wall: No tenderness.  Abdominal:     General: There is no distension.     Palpations: Abdomen is soft. There is no mass.     Tenderness: There is abdominal tenderness. There is no right CVA tenderness, left CVA tenderness, guarding or rebound.     Hernia: No hernia is present.     Comments: Mildly tender to palpation in the epigastric region without rebound or guarding.  Normoactive bowel sounds.  Negative Murphy sign.  No CVA tenderness bilaterally.  No tenderness over McBurney's point.  Musculoskeletal:     Cervical back: Neck supple.  Skin:    General: Skin is warm.     Findings: No rash.  Neurological:     Mental Status: She is alert.  Psychiatric:        Behavior: Behavior normal.     ED Course/Procedures   Clinical Course as of Oct 05 2350  Sat Oct 06, 2019  2241 Patient presenting with acute epigastric pain for 3 days with nausea and vomiting.  Also has been having more chronic left lower quadrant pain with decreased bowel movements. In the setting of her taking increased NSAIDs lately this could be gastritis vs PUD.  Will try GI cocktail. However, also given her left  lower quadrant symptoms and tenderness on exam, I think a CT scan may be helpful in ruling out other intraabdominal process such as colitis.    [KM]  2302 If workup is negative, plan to send out with PPI, stop Nsaids and f/u GI. Care passed to [facility] due to change of shift.    [KM]    Clinical Course User Index [KM] Jeral Pinch    Procedures  MDM   23 year old female received at sign out from [facility] pending labs and imaging. Please see her note for further work up and medical decision making.  In brief, the patient has been taking NSAIDs and has noted a burning sensation in her epigastric region.  She had some vomiting yesterday, but none for the last 24 hours.  She also struggles with chronic constipation.  She  has not established with GI.  CT abdomen pelvis with constipation, but no evidence of obstruction.  No other acute findings.  CT findings were discussed with the patient.  Reports that epigastric pain has significantly improved following GI cocktail, but has now slightly worsened.  We will give the patient a dose of Pepcid and Carafate and will discharge her with a PPI and Carafate given concern for PUD versus gastritis.she can be further evaluated by GI.  She is advised to continue with her home constipation regimen.  All questions answered and she is agreeable with plan at this time.  ER return precautions given. She is hemodynamically stable and in no acute distress.  Safe for discharge to home with outpatient follow-up to GI.   Barkley Boards, PA-C 10/07/19 4967    Shon Baton, MD 10/07/19 [phone]

## 2019-10-07 ENCOUNTER — Emergency Department (HOSPITAL_COMMUNITY)

## 2019-10-07 ENCOUNTER — Encounter (HOSPITAL_COMMUNITY): Payer: Self-pay

## 2019-10-07 LAB — COMPREHENSIVE METABOLIC PANEL
ALT: 21 U/L (ref 0–44)
AST: 17 U/L (ref 15–41)
Albumin: 4.6 g/dL (ref 3.5–5.0)
Alkaline Phosphatase: 70 U/L (ref 38–126)
Anion gap: 9 (ref 5–15)
BUN: 8 mg/dL (ref 6–20)
CO2: 22 mmol/L (ref 22–32)
Calcium: 9.5 mg/dL (ref 8.9–10.3)
Chloride: 108 mmol/L (ref 98–111)
Creatinine, Ser: 0.82 mg/dL (ref 0.44–1.00)
GFR calc Af Amer: >60 mL/min (ref >60)
GFR calc non Af Amer: >60 mL/min (ref >60)
Glucose, Bld: 95 mg/dL (ref 70–99)
Potassium: 4 mmol/L (ref 3.5–5.1)
Sodium: 139 mmol/L (ref 135–145)
Total Bilirubin: 0.4 mg/dL (ref 0.3–1.2)
Total Protein: 7.8 g/dL (ref 6.5–8.1)

## 2019-10-07 LAB — LIPASE, BLOOD: Lipase: 34 U/L (ref 11–51)

## 2019-10-07 MED ORDER — IOHEXOL 300 MG/ML  SOLN
100.0000 mL | Freq: Once | INTRAMUSCULAR | Status: AC | PRN
  Administered 2019-10-07: 01:00:00 100 mL via INTRAVENOUS

## 2019-10-07 MED ORDER — SODIUM CHLORIDE (PF) 0.9 % IJ SOLN
INTRAMUSCULAR | Status: AC
  Filled 2019-10-07: qty 50

## 2019-10-07 MED ORDER — SUCRALFATE 1 G PO TABS
1.0000 g | ORAL_TABLET | Freq: Once | ORAL | Status: AC
  Administered 2019-10-07: 1 g via ORAL
  Filled 2019-10-07: qty 1

## 2019-10-07 MED ORDER — SUCRALFATE 1 G PO TABS: 1.0000 g | ORAL_TABLET | Freq: Four times a day (QID) | ORAL | 0 refills | Status: AC

## 2019-10-07 MED ORDER — OMEPRAZOLE 20 MG PO CPDR: 20.0000 mg | DELAYED_RELEASE_CAPSULE | Freq: Every day | ORAL | 0 refills | Status: AC

## 2019-10-07 MED ORDER — FAMOTIDINE IN NACL 20-0.9 MG/50ML-% IV SOLN
20.0000 mg | Freq: Once | INTRAVENOUS | Status: AC
  Administered 2019-10-07: 20 mg via INTRAVENOUS
  Filled 2019-10-07: qty 50

## 2019-10-07 NOTE — Discharge Instructions (Addendum)
Thank you for allowing me to care for you today in the Emergency Department.   Please call to schedule a follow-up appointment with gastroenterology.  Take 1 tablet of omeprazole by mouth daily.  Take 1 tablet of Carafate every 6 hours.  I have attached an eating plan that may help to improve your pain.  Continue your home constipation regimen.  Return to the emergency department if you develop new or worsening symptoms, including black or bloody stools or vomiting, high fevers, uncontrollable abdominal pain, persistent vomiting despite taking nausea medicine, or other new, concerning symptoms.

## 2019-11-12 IMAGING — US US PELVIS COMPLETE
1 series · 13 of 25 positions shown · non-contrast
Comparison: None.

CLINICAL DATA: 23-year-old female with left lower quadrant pain.
Negative quantitative beta HCG today.

EXAM:
TRANSABDOMINAL AND TRANSVAGINAL ULTRASOUND OF PELVIS
DOPPLER ULTRASOUND OF OVARIES
TECHNIQUE: Both transabdominal and transvaginal ultrasound examinations of the
pelvis were performed. Transabdominal technique was performed for
global imaging of the pelvis including uterus, ovaries, adnexal
regions, and pelvic cul-de-sac.
It was necessary to proceed with endovaginal exam following the
transabdominal exam to visualize the ovaries. Color and duplex
Doppler ultrasound was utilized to evaluate blood flow to the
ovaries.

[Series 1: us pelvis complete · 127 acquisitions, 13 frames shown]
[im 1/127]
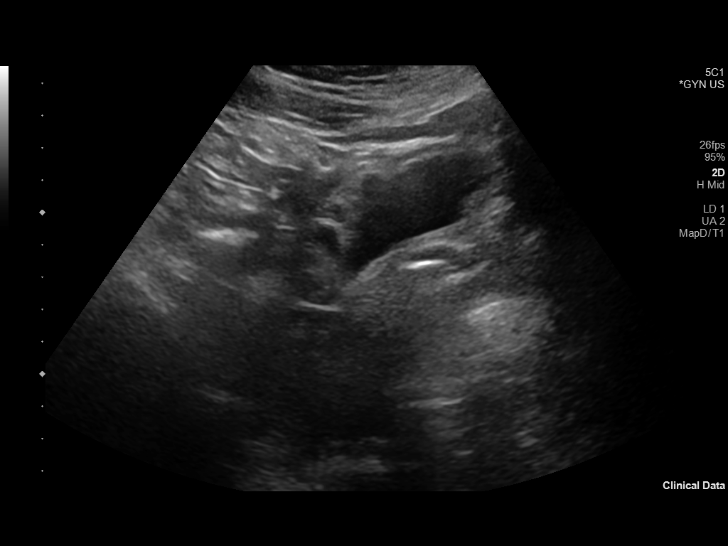
[im 11/127]
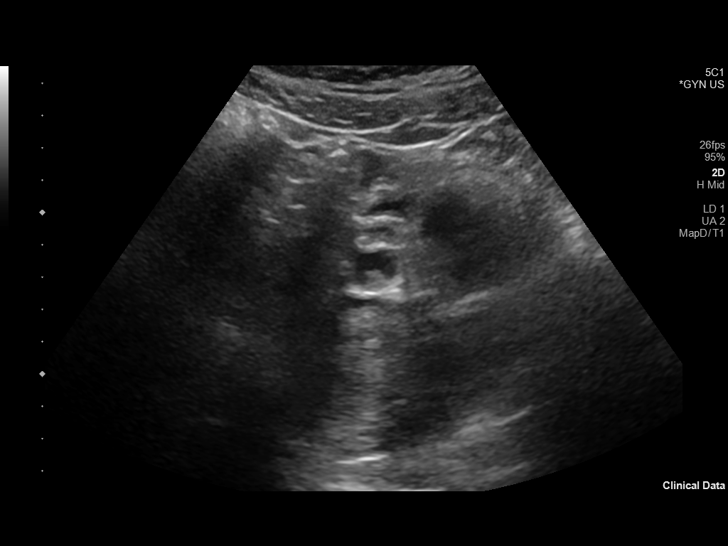
[im 22/127]
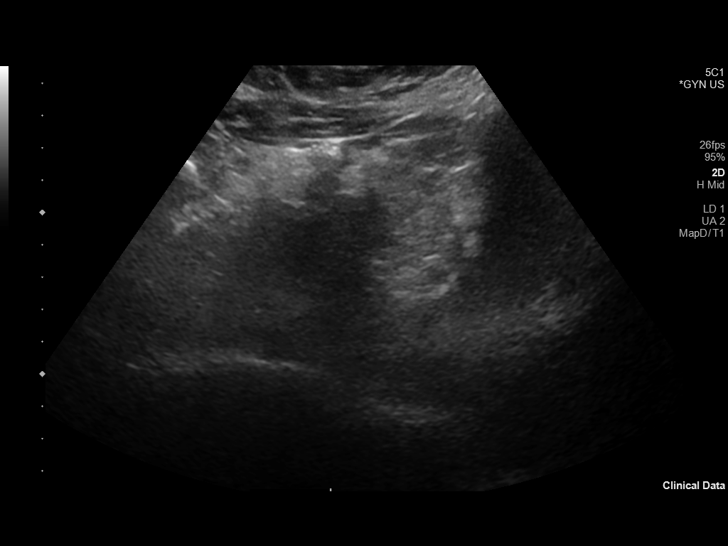
[im 32/127]
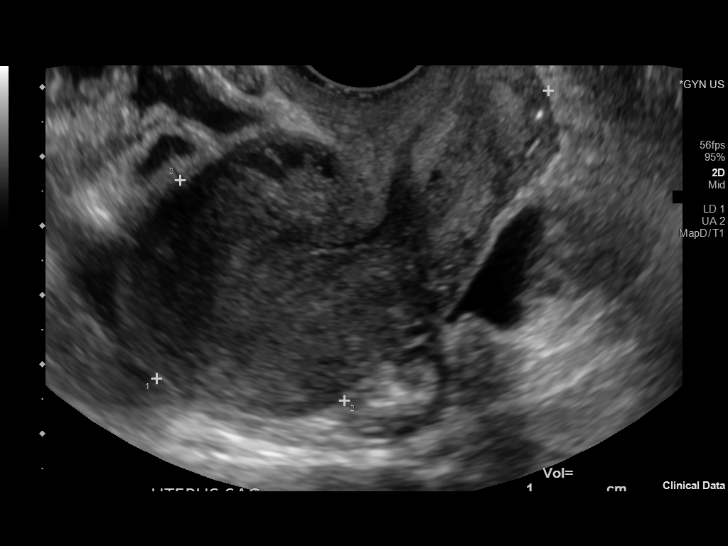
[im 43/127]
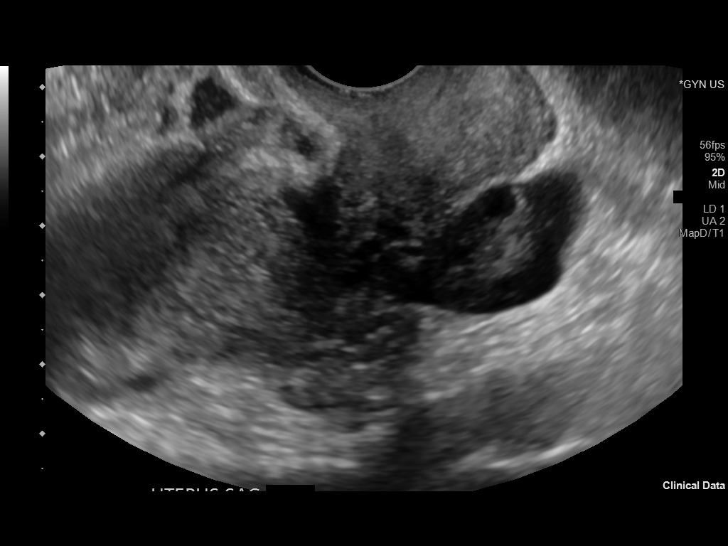
[im 53/127]
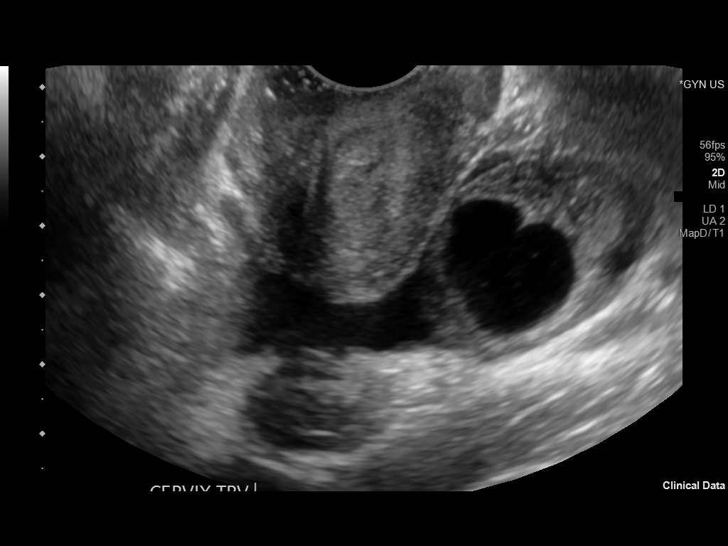
[im 64/127]
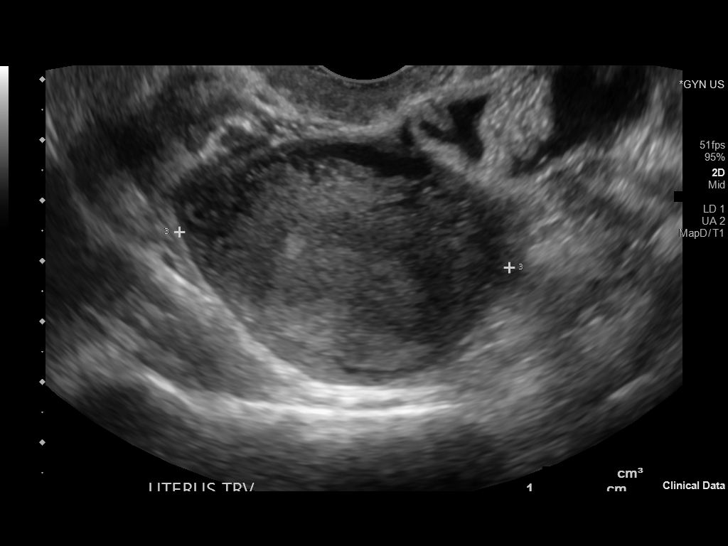
[im 74/127]
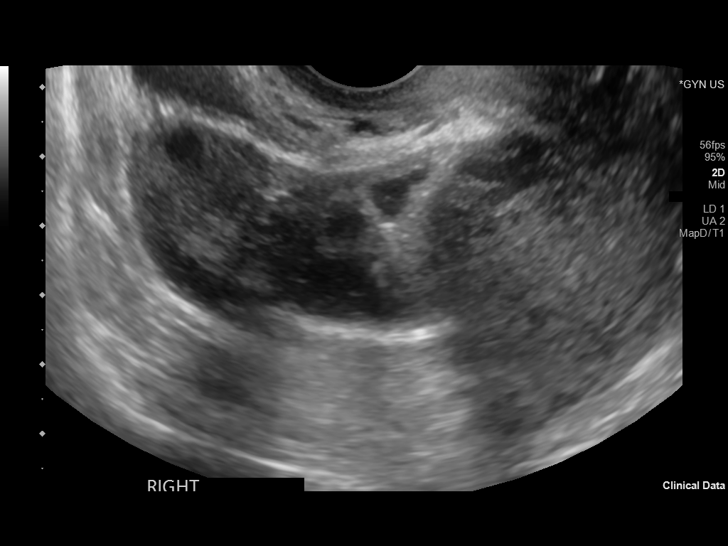
[im 85/127]
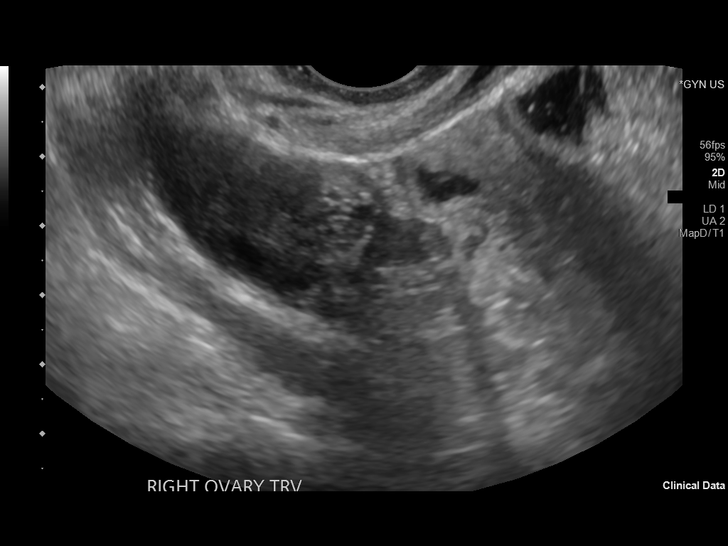
[im 95/127]
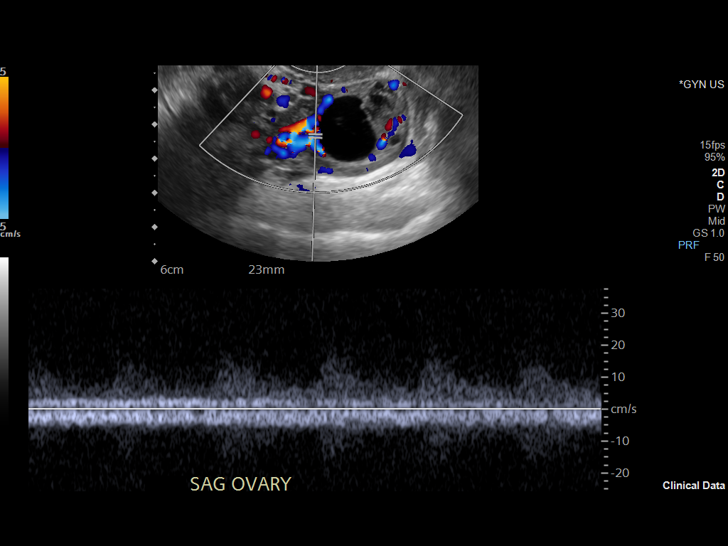
[im 106/127]
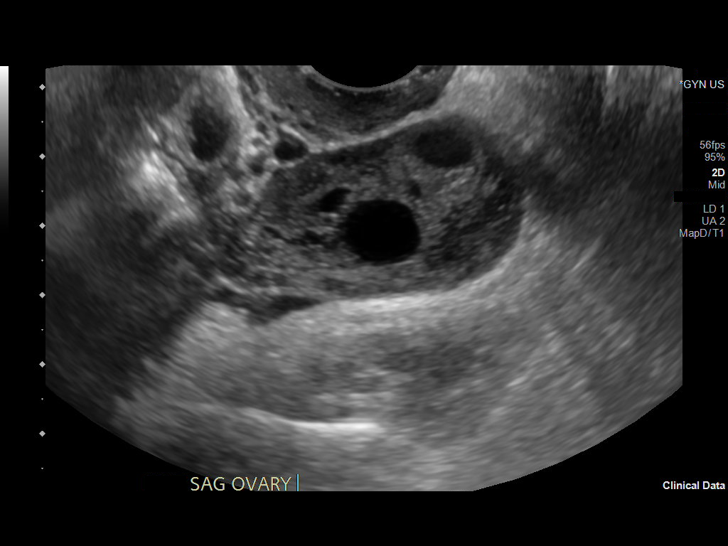
[im 116/127]
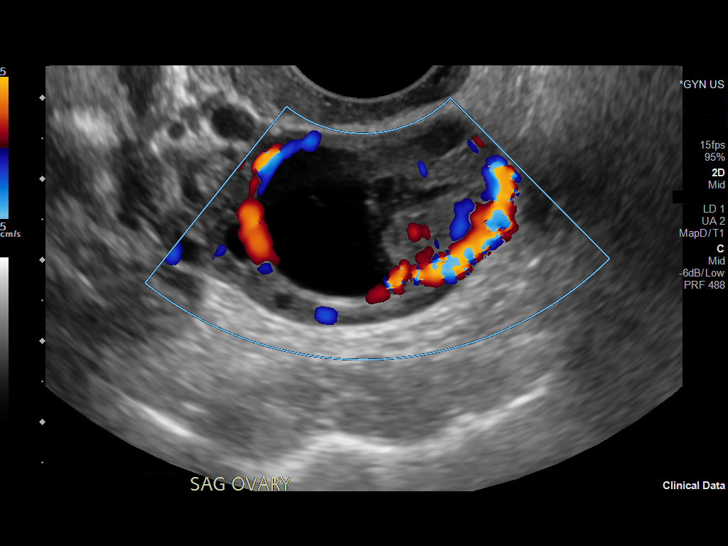
[im 127/127]
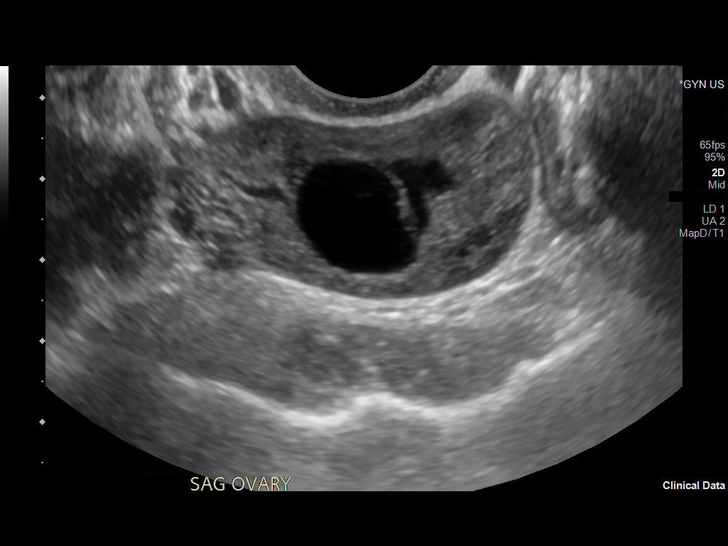

[13 of 25 positions shown; findings below may reference images not displayed]

FINDINGS: Uterus

Measurements: 7.0 x 4.0 x 5.5 centimeters = volume: 80 mL. No
fibroids or other mass visualized.

Endometrium

Thickness: 6 millimeters.  No focal abnormality visualized.

Right ovary

Measurements: 3.8 x 2.1 by 2.4 centimeters = volume: 10 mL. Small
follicles. No abnormality.

Left ovary

Measurements: 4.5 x 2.8 x 3.9 centimeters = volume: 25 mL. Dominant
17 millimeter follicle or cyst with minimal internal reticular
echoes (image 95). No solid elements, but there is peripheral
hypervascularity (image 122). Multiple smaller follicles.

Pulsed Doppler evaluation of both ovaries demonstrates normal
low-resistance arterial and venous waveforms.

Other findings

Trace or small simple appearing pelvic free fluid (image 30).
IMPRESSION: 1. Negative for ovarian torsion.  Negative uterus.
2. Left ovary 17 mm cyst with mild internal reticular echoes and
peripheral hypervascularity. Favor this is a mildly hemorrhagic cyst
or corpus luteum.
Recommend 6-12 week repeat ultrasound to document resolution. This
recommendation follows the consensus statement: Management of
Asymptomatic Ovarian and Other Adnexal Cysts Imaged at US: Society
of Radiologists in Ultrasound Consensus Conference Statement.
3. Trace pelvic free fluid, likely physiologic.

## 2020-04-18 IMAGING — CT CT ABDOMEN W/ CM
2 of 4 series · 16 of 46 positions shown, 18 images · IV contrast (omnipaque)
Comparison: Abdominal radiograph 10/04/2019

CLINICAL DATA: Epigastric pain and vomiting.

EXAM:
CT ABDOMEN WITH CONTRAST
TECHNIQUE: Multidetector CT imaging of the abdomen was performed using the
standard protocol following bolus administration of intravenous
contrast.
CONTRAST:  100mL OMNIPAQUE IOHEXOL 300 MG/ML  SOLN

[Series 2: axial st · axial · 0.74mm/px · z∈[-310,-100]mm · 13 of 48 slices shown, 15 images]
[im 3/48  soft-tissue]
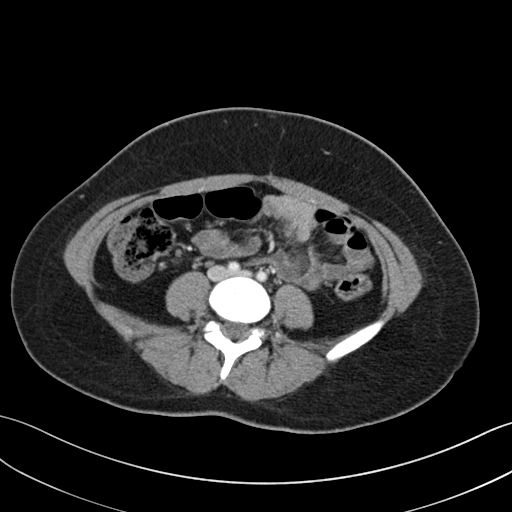
[im 3/48  bone]
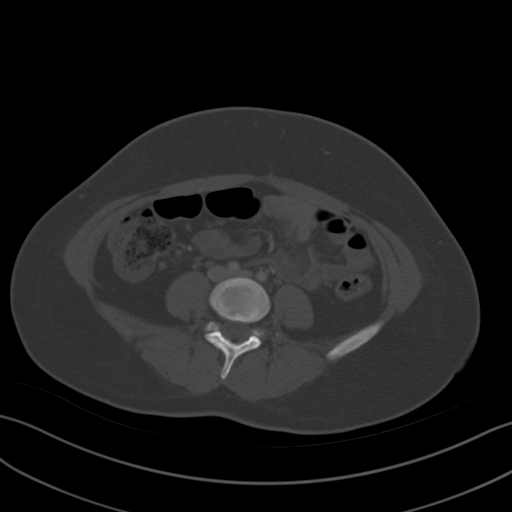
[im 6/48  soft-tissue]
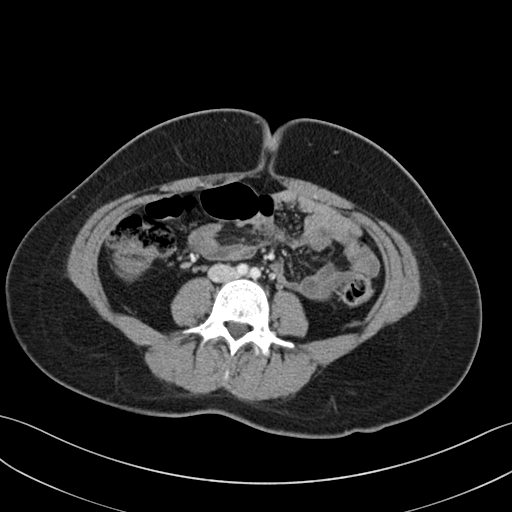
[im 9/48  soft-tissue]
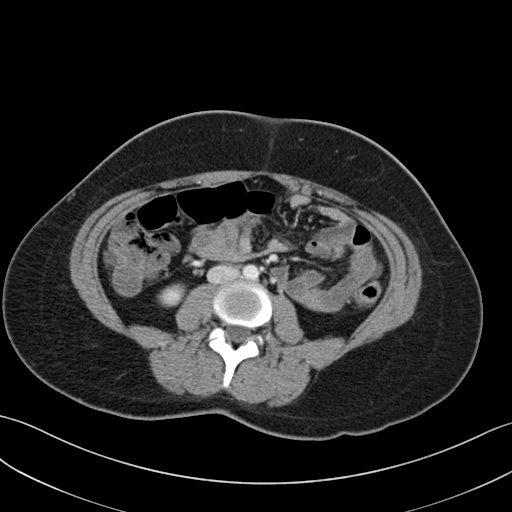
[im 15/48  soft-tissue]
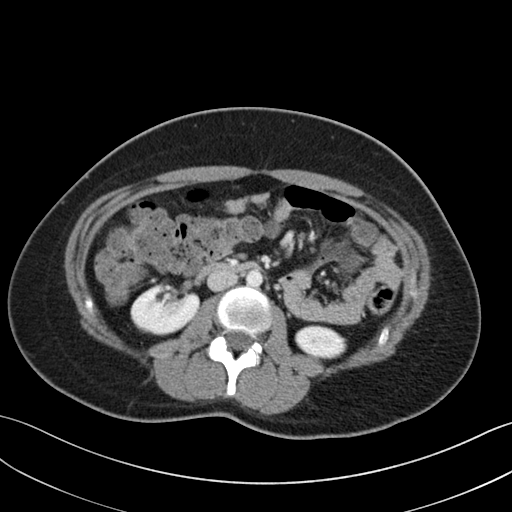
[im 18/48  soft-tissue]
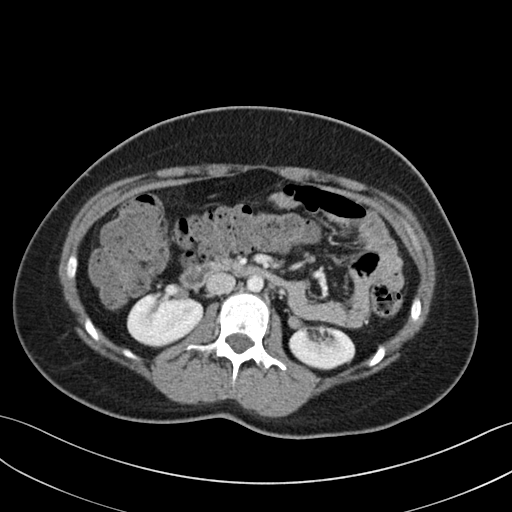
[im 21/48  soft-tissue]
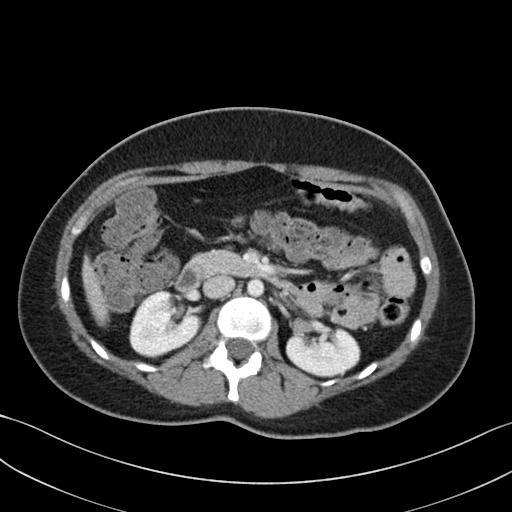
[im 24/48  soft-tissue]
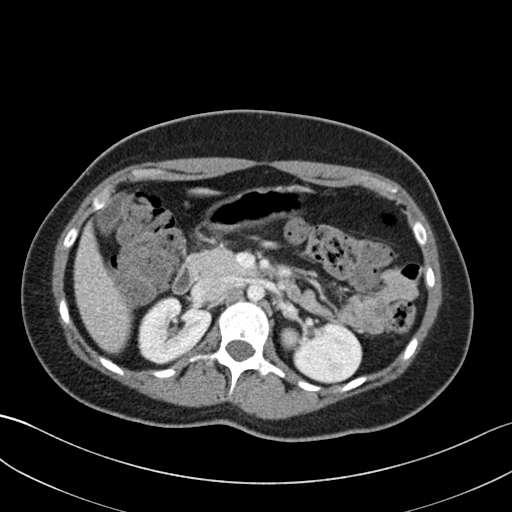
[im 27/48  soft-tissue]
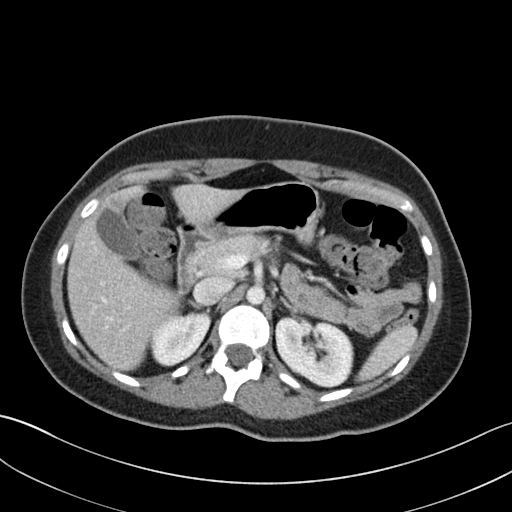
[im 30/48  soft-tissue]
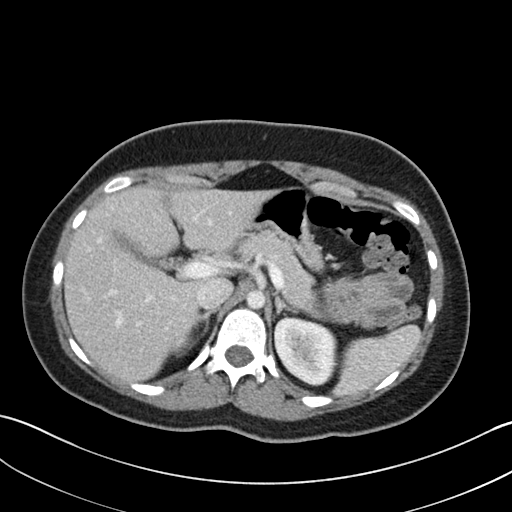
[im 30/48  bone]
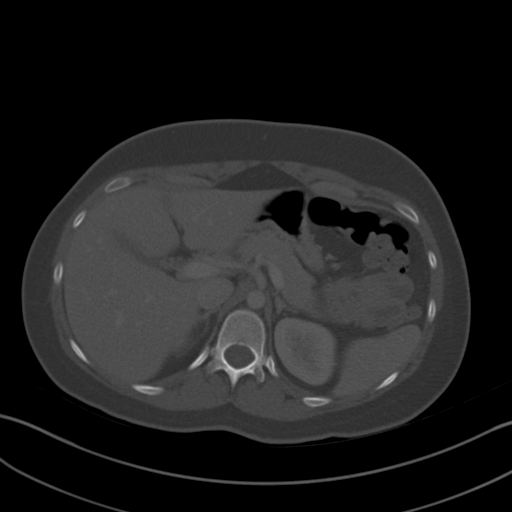
[im 33/48  soft-tissue]
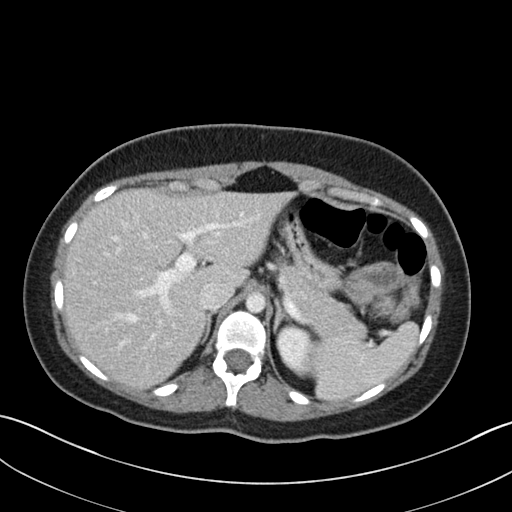
[im 39/48  soft-tissue]
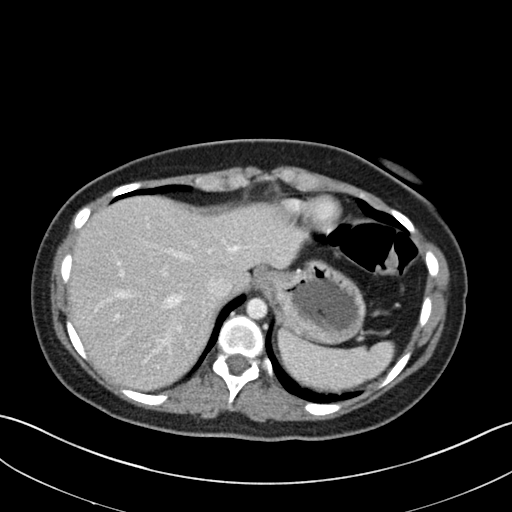
[im 42/48  soft-tissue]
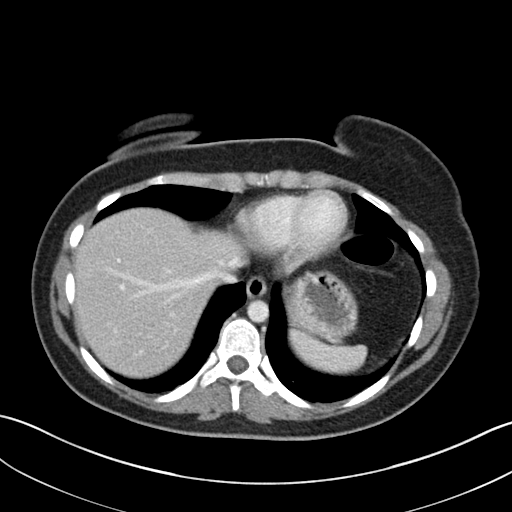
[im 45/48  soft-tissue]
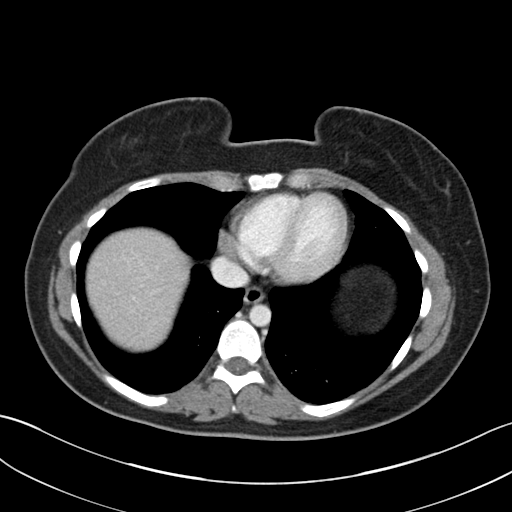

[Series 5: coronal st · coronal · 0.49mm/px · 3 of 84 slices shown]
[im 28/84  soft-tissue]
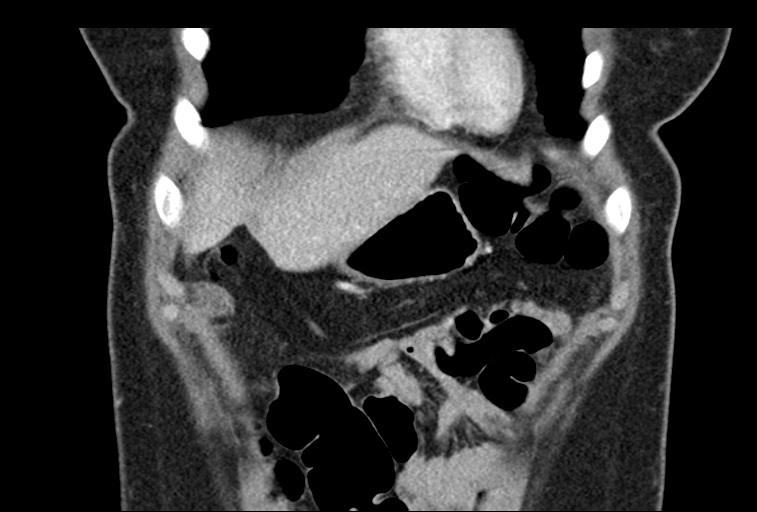
[im 37/84  soft-tissue]
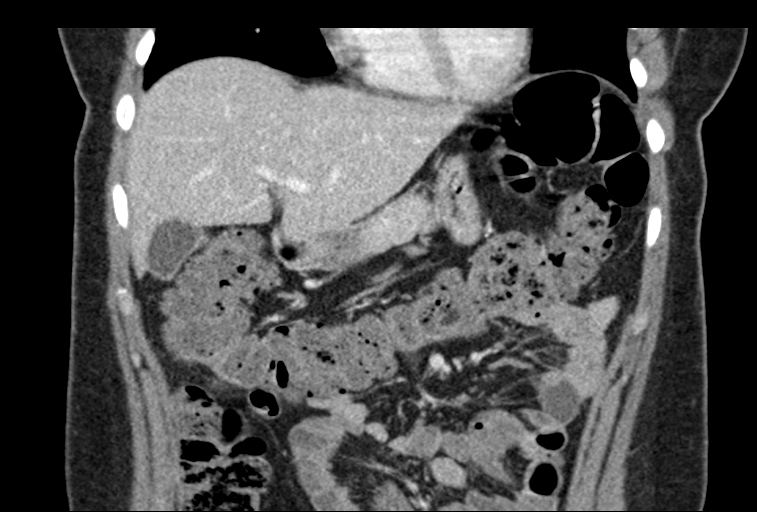
[im 47/84  soft-tissue]
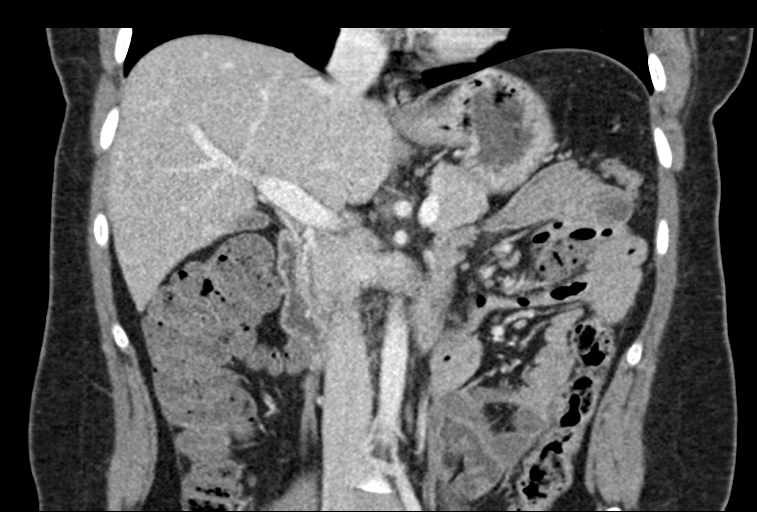

[16 of 46 positions shown; findings below may reference images not displayed]

FINDINGS: Lower chest: Lung bases are clear.

Hepatobiliary: No focal liver abnormality is seen. No gallstones,
gallbladder wall thickening, or biliary dilatation.

Pancreas: No ductal dilatation or inflammation.

Spleen: Normal in size without focal abnormality.

Adrenals/Urinary Tract: Normal adrenal glands. No hydronephrosis or
perinephric edema. No focal renal lesion.

Stomach/Bowel: Stomach is nondistended. No gastric wall thickening.
No bowel wall thickening of included abdominal bowel loops. Moderate
stool in the included colon. No evidence of obstruction.

Vascular/Lymphatic: No significant vascular findings are present. No
enlarged abdominal or pelvic lymph nodes.

Other: Tiny fat containing umbilical hernia. No free air. No free
fluid.

Musculoskeletal: Negative.
IMPRESSION: No acute abnormality or explanation for epigastric pain.

Moderate stool in the included colon.
